# Patient Record
Sex: Female | Born: 1995
Health system: Southern US, Community
[De-identification: ages and names within clinical notes are randomized; demographics above are authoritative.]

## PROBLEM LIST (undated history)

## (undated) DIAGNOSIS — R7303 Prediabetes: Secondary | ICD-10-CM

## (undated) DIAGNOSIS — Z8619 Personal history of other infectious and parasitic diseases: Secondary | ICD-10-CM

## (undated) HISTORY — DX: Prediabetes: R73.03

## (undated) HISTORY — DX: Personal history of other infectious and parasitic diseases: Z86.19

---

## 2020-01-08 ENCOUNTER — Other Ambulatory Visit: Payer: Self-pay

## 2020-01-08 ENCOUNTER — Ambulatory Visit: Payer: 59 | Admitting: Obstetrics & Gynecology

## 2020-01-08 ENCOUNTER — Encounter: Payer: Self-pay | Admitting: Obstetrics & Gynecology

## 2020-01-08 ENCOUNTER — Other Ambulatory Visit (HOSPITAL_COMMUNITY)
Admission: RE | Admit: 2020-01-08 | Discharge: 2020-01-08 | Disposition: A | Discharge status: Home / Self Care | Payer: 59 | Source: Ambulatory Visit | Attending: Obstetrics & Gynecology | Admitting: Obstetrics & Gynecology

## 2020-01-08 VITALS — BP 120/80 | HR 68 | Temp 97.3°F | Resp 10 | Ht 63.25 in | Wt 177.0 lb

## 2020-01-08 DIAGNOSIS — Z01419 Encounter for gynecological examination (general) (routine) without abnormal findings: Secondary | ICD-10-CM | POA: Diagnosis not present

## 2020-01-08 DIAGNOSIS — Z124 Encounter for screening for malignant neoplasm of cervix: Secondary | ICD-10-CM

## 2020-01-08 NOTE — Progress Notes (Signed)
24 y.o. Lisa Rogers here as a new patient for an annual exam.  Works at [employer], [employer], and teaches K-2.  Is not SA.  Did have STD testing after last partner.    Cycles are regular.  Flow lasts about 5 days.  Flow is moderate.    Gardisil vaccination done in 2009.    Patient's last menstrual period was 12/30/2019.          Sexually active: No.  The current method of family planning is abstinence.    Exercising: No.  The patient does not participate in regular exercise at present. Smoker:  no  Health Maintenance: Pap:  05/28/16 Neg -- in Care Everywhere History of abnormal Pap:  no TDaP:  UTD per patient -- possibly done in 2014 before school Screening Labs: discuss if needed   reports that she has never smoked. She has never used smokeless tobacco. She reports current alcohol use. She reports that she does not use drugs.  Past Medical History:  Diagnosis Date  . STD (sexually transmitted disease)    chlamydia    History reviewed. No pertinent surgical history.  Current Outpatient Medications  Medication Sig Dispense Refill  . VENTOLIN HFA 108 (90 Base) MCG/ACT inhaler as needed.     No current facility-administered medications for this visit.    Family History  Problem Relation Age of Onset  . Hypertension Mother   . Arthritis Father   . Hypertension Sister   . Hypertension Maternal Grandmother   . Diabetes Paternal Grandfather   . Asthma Sister   . Gallstones Sister     Review of Systems  All other systems reviewed and are negative.   Exam:   BP 120/80 (BP Location: Right Arm, Patient Position: Sitting, Cuff Size: Normal)   Pulse 68   Temp (!) 97.3 F (36.3 C) (Temporal)   Resp 10   Ht 5' 3.25" (1.607 m)   Wt 177 lb (80.3 kg)   LMP 12/30/2019   BMI 31.11 kg/m    Height: 5' 3.25" (160.7 cm)  Ht Readings from Last 3 Encounters:  01/08/20 5' 3.25" (1.607 m)   General appearance: alert, cooperative  and appears stated age Head: Normocephalic, without obvious abnormality, atraumatic Neck: no adenopathy, supple, symmetrical, trachea midline and thyroid normal to inspection and palpation Lungs: clear to auscultation bilaterally Breasts: normal appearance, no masses or tenderness Heart: regular rate and rhythm Abdomen: soft, non-tender; bowel sounds normal; no masses,  no organomegaly Extremities: extremities normal, atraumatic, no cyanosis or edema Skin: Skin color, texture, turgor normal. No rashes or lesions Lymph nodes: Cervical, supraclavicular, and axillary nodes normal. No abnormal inguinal nodes palpated Neurologic: Grossly normal   Pelvic: External genitalia:  no lesions              Urethra:  normal appearing urethra with no masses, tenderness or lesions              Bartholins and Skenes: normal                 Vagina: normal appearing vagina with normal color and discharge, no lesions              Cervix: no lesions              Pap taken: Yes.   Bimanual Exam:  Uterus:  normal size, contour, position, consistency, mobility, non-tender              Adnexa:  normal adnexa and no mass, fullness, tenderness               Rectovaginal: Confirms               Anus:  normal sphincter tone, no lesions  Chaperone, Zenovia Jordan, CMA, was present for exam.  A:  Well Woman with normal exam Not SA H/o chlamydia in 2017 with negative follow up testing.  P:   Mammogram guidelines reviewed pap smear obtained today Return annually or prn

## 2020-01-10 LAB — CYTOLOGY - PAP: Diagnosis: NEGATIVE

## 2020-09-23 ENCOUNTER — Other Ambulatory Visit: Payer: Self-pay

## 2020-09-23 ENCOUNTER — Encounter: Payer: Self-pay | Admitting: Obstetrics and Gynecology

## 2020-09-23 ENCOUNTER — Ambulatory Visit: Payer: BC Managed Care – PPO | Admitting: Obstetrics and Gynecology

## 2020-09-23 ENCOUNTER — Telehealth: Payer: Self-pay

## 2020-09-23 VITALS — BP 110/64 | HR 87 | Ht 63.0 in | Wt 180.4 lb

## 2020-09-23 DIAGNOSIS — Z113 Encounter for screening for infections with a predominantly sexual mode of transmission: Secondary | ICD-10-CM | POA: Diagnosis not present

## 2020-09-23 NOTE — Progress Notes (Signed)
GYNECOLOGY  VISIT   HPI: 24 y.o.   Single Black or African American Not Hispanic or Latino  female   G0P0000 with Patient's last menstrual period was 09/11/2020.   here for STD testing. Patient has SA with new partner x 1-2 months. Patient is not having symptoms, no known exposure. No discharge, irritation, odor or itching.   GYNECOLOGIC HISTORY: Patient's last menstrual period was 09/11/2020. Contraception:condoms Menopausal hormone therapy: none         OB History    Gravida  0   Para  0   Term  0   Preterm  0   AB  0   Living  0     SAB  0   TAB  0   Ectopic  0   Multiple  0   Live Births  0              There are no problems to display for this patient.   Past Medical History:  Diagnosis Date  . History of chlamydia     History reviewed. No pertinent surgical history.  No current outpatient medications on file.   No current facility-administered medications for this visit.     ALLERGIES: Patient has no known allergies.  Family History  Problem Relation Age of Onset  . Hypertension Mother   . Arthritis Father   . Hypertension Sister   . Hypertension Maternal Grandmother   . Diabetes Paternal Grandfather   . Asthma Sister   . Gallstones Sister     Social History   Socioeconomic History  . Marital status: Single    Spouse name: Not on file  . Number of children: Not on file  . Years of education: Not on file  . Highest education level: Not on file  Occupational History  . Not on file  Tobacco Use  . Smoking status: Never Smoker  . Smokeless tobacco: Never Used  Vaping Use  . Vaping Use: Never used  Substance and Sexual Activity  . Alcohol use: Yes    Comment: occ  . Drug use: Never  . Sexual activity: Not Currently    Birth control/protection: Abstinence, None  Other Topics Concern  . Not on file  Social History Narrative  . Not on file   Social Determinants of Health   Financial Resource Strain:   . Difficulty of  Paying Living Expenses: Not on file  Food Insecurity:   . Worried About Programme researcher, broadcasting/film/video in the Last Year: Not on file  . Ran Out of Food in the Last Year: Not on file  Transportation Needs:   . Lack of Transportation (Medical): Not on file  . Lack of Transportation (Non-Medical): Not on file  Physical Activity:   . Days of Exercise per Week: Not on file  . Minutes of Exercise per Session: Not on file  Stress:   . Feeling of Stress : Not on file  Social Connections:   . Frequency of Communication with Friends and Family: Not on file  . Frequency of Social Gatherings with Friends and Family: Not on file  . Attends Religious Services: Not on file  . Active Member of Clubs or Organizations: Not on file  . Attends Banker Meetings: Not on file  . Marital Status: Not on file  Intimate Partner Violence:   . Fear of Current or Ex-Partner: Not on file  . Emotionally Abused: Not on file  . Physically Abused: Not on file  . Sexually  Abused: Not on file    Review of Systems  All other systems reviewed and are negative.   PHYSICAL EXAMINATION:    BP 110/64   Pulse 87   Ht 5\' 3"  (1.6 m)   Wt 180 lb 6.4 oz (81.8 kg)   LMP 09/11/2020   SpO2 98%   BMI 31.96 kg/m     General appearance: alert, cooperative and appears stated age  Pelvic: External genitalia:  no lesions              Urethra:  normal appearing urethra with no masses, tenderness or lesions              Bartholins and Skenes: normal                 Vagina: normal appearing vagina with a slight increase in watery, white vaginal d/c             Cervix: no lesions               Chaperone was present for exam.  ASSESSMENT Screening for STD Slight increase in vaginal d/c, no symptoms    PLAN GC/CT/trich/hiv/rpr Continue condoms for contraception and STD testing, declines other birth control

## 2020-09-23 NOTE — Telephone Encounter (Signed)
Appointment Request From: Amada Kingfisher    With Provider: Jerene Bears, MD [facility]]    Preferred Date Range: 09/23/2020 - 09/24/2020    Preferred Times: Any Time    Reason for visit: Request an Appointment    Comments:  STD/STI testing

## 2020-09-23 NOTE — Telephone Encounter (Signed)
AEX 01/2020- SM H/o Chlamydia 2017  Spoke with pt. Pt states having SA with new partner x 1-2 months ago and would like STD testing. Pt denies any symptoms or exposure of known STD. Pt advised to have OV. Pt agreeable.  Pt scheduled with Dr Oscar La on 11/22 at 11 am. Pt agreeable to date and time of appt. Pt verbalized understanding Dr Hyacinth Meeker not in office.  Encounter closed

## 2020-09-24 LAB — RPR: RPR Ser Ql: NONREACTIVE

## 2020-09-24 LAB — HIV ANTIBODY (ROUTINE TESTING W REFLEX): HIV Screen 4th Generation wRfx: NONREACTIVE

## 2020-09-27 LAB — CHLAMYDIA/GONOCOCCUS/TRICHOMONAS, NAA
Chlamydia by NAA: NEGATIVE
Gonococcus by NAA: NEGATIVE
Trich vag by NAA: NEGATIVE

## 2021-04-05 DIAGNOSIS — N912 Amenorrhea, unspecified: Secondary | ICD-10-CM | POA: Diagnosis not present

## 2021-04-07 ENCOUNTER — Ambulatory Visit: Payer: BC Managed Care – PPO | Admitting: Obstetrics and Gynecology

## 2021-04-11 ENCOUNTER — Ambulatory Visit: Payer: 59

## 2021-04-14 ENCOUNTER — Ambulatory Visit: Payer: BC Managed Care – PPO | Admitting: Obstetrics and Gynecology

## 2021-04-15 ENCOUNTER — Ambulatory Visit: Payer: BC Managed Care – PPO | Admitting: Obstetrics and Gynecology

## 2021-04-25 ENCOUNTER — Ambulatory Visit: Payer: Self-pay | Admitting: Obstetrics and Gynecology

## 2021-05-22 NOTE — Progress Notes (Signed)
25 y.o. G0P0000 Single Black or African American Not Hispanic or Latino female here for annual exam.  Patient had medical abortion in June. Patient states that since she started taking the birth control she had been spotting constantly. She just finished her first pack of pills. Doing okay on OCP's other than the spotting.    She is sexually active, same partner x 4 months.   Patient's last menstrual period was 03/05/2021.          Sexually active: Yes.    The current method of family planning is OCP (estrogen/progesterone).    Exercising: Yes.     Walking  Smoker:  no  Health Maintenance: Pap:  01/08/20 Normal ,05/28/16 Neg -- in Care Everywhere History of abnormal Pap:  no MMG:  none  BMD:   none  Colonoscopy: none  [facility]P:  [facility] Gardasil: Complete    reports that she has never smoked. She has never used smokeless tobacco. She reports current alcohol use. She reports that she does not use drugs. Just occasional ETOH.  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   Past Medical History:  Diagnosis Date   History of chlamydia     No past surgical history on file.  Current Outpatient Medications  Medication Sig Dispense Refill   albuterol (VENTOLIN HFA) 108 (90 Base) MCG/ACT inhaler if needed.     norgestimate-ethinyl estradiol (ORTHO-CYCLEN) 0.25-35 MG-MCG tablet Take 1 tablet by mouth daily.     No current facility-administered medications for this visit.    Family History  Problem Relation Age of Onset   Hypertension Mother    Arthritis Father    Hypertension Sister    Hypertension Maternal Grandmother    Diabetes Paternal Grandfather    Asthma Sister    Gallstones Sister     Review of Systems  All other systems reviewed and are negative.  Exam:   BP 112/66   Pulse 67   Ht 5\' 3"  (1.6 m)   Wt 184 lb (83.5 kg)   LMP 03/05/2021   SpO2 98%   BMI 32.59 kg/m   Weight  change: @WEIGHTCHANGE @ Height:   Height: 5\' 3"  (160 cm)  Ht Readings from Last 3 Encounters:  05/26/21 5\' 3"  (1.6 m)  09/23/20 5\' 3"  (1.6 m)  01/08/20 5' 3.25" (1.607 m)    General appearance: alert, cooperative and appears stated age Head: Normocephalic, without obvious abnormality, atraumatic Neck: no adenopathy, supple, symmetrical, trachea midline and thyroid normal to inspection and palpation Lungs: clear to auscultation bilaterally Cardiovascular: regular rate and rhythm Breasts: normal appearance, no masses or tenderness Abdomen: soft, non-tender; non distended,  no masses,  no organomegaly Extremities: extremities normal, atraumatic, no cyanosis or edema Skin: Skin color, texture, turgor normal. No rashes or lesions Lymph nodes: Cervical, supraclavicular, and axillary nodes normal. No abnormal inguinal nodes palpated Neurologic: Grossly normal   Pelvic: External genitalia:  no lesions              Urethra:  normal appearing urethra with no masses, tenderness or lesions              Bartholins and Skenes: normal                 Vagina: normal appearing vagina with normal color and discharge, no lesions              Cervix: no lesions  Bimanual Exam:  Uterus:  normal size, contour, position, consistency, mobility, non-tender              Adnexa: no mass, fullness, tenderness               Rectovaginal: Confirms               Anus:  normal sphincter tone, no lesions  Carolynn Serve chaperoned for the exam.  1. Well woman exam Discussed breast self exam Discussed calcium and vit D intake No pap this year  2. Screening examination for STD (sexually transmitted disease) - HIV Antibody (routine testing w rflx) - RPR - SURESWAB CT/NG/T. vaginalis  3. Laboratory exam ordered as part of routine general medical examination - CBC - Comprehensive metabolic panel - Lipid panel  4. Encounter for surveillance of contraceptive pills - norgestimate-ethinyl estradiol  (ORTHO-CYCLEN) 0.25-35 MG-MCG tablet; Take 1 tablet by mouth daily.  Dispense: 84 tablet; Refill:  -Condom use encouraged

## 2021-05-23 ENCOUNTER — Ambulatory Visit: Payer: Self-pay | Admitting: Obstetrics and Gynecology

## 2021-05-26 ENCOUNTER — Ambulatory Visit (INDEPENDENT_AMBULATORY_CARE_PROVIDER_SITE_OTHER): Payer: BC Managed Care – PPO | Admitting: Obstetrics and Gynecology

## 2021-05-26 ENCOUNTER — Other Ambulatory Visit: Payer: Self-pay

## 2021-05-26 ENCOUNTER — Encounter: Payer: Self-pay | Admitting: Obstetrics and Gynecology

## 2021-05-26 VITALS — BP 112/66 | HR 67 | Ht 63.0 in | Wt 184.0 lb

## 2021-05-26 DIAGNOSIS — R1310 Dysphagia, unspecified: Secondary | ICD-10-CM | POA: Insufficient documentation

## 2021-05-26 DIAGNOSIS — Z0000 Encounter for general adult medical examination without abnormal findings: Secondary | ICD-10-CM

## 2021-05-26 DIAGNOSIS — Z01419 Encounter for gynecological examination (general) (routine) without abnormal findings: Secondary | ICD-10-CM

## 2021-05-26 DIAGNOSIS — Z3041 Encounter for surveillance of contraceptive pills: Secondary | ICD-10-CM

## 2021-05-26 DIAGNOSIS — Z113 Encounter for screening for infections with a predominantly sexual mode of transmission: Secondary | ICD-10-CM | POA: Diagnosis not present

## 2021-05-26 MED ORDER — NORGESTIMATE-ETH ESTRADIOL 0.25-35 MG-MCG PO TABS: 1.0000 | ORAL_TABLET | Freq: Every day | ORAL | 3 refills | Status: DC

## 2021-05-26 NOTE — Patient Instructions (Signed)

## 2021-05-27 ENCOUNTER — Encounter: Payer: Self-pay | Admitting: Obstetrics and Gynecology

## 2021-05-27 LAB — SURESWAB CT/NG/T. VAGINALIS
C. trachomatis RNA, TMA: NOT DETECTED
N. gonorrhoeae RNA, TMA: NOT DETECTED
Trichomonas vaginalis RNA: NOT DETECTED

## 2021-05-30 LAB — CBC
HCT: 36.8 % (ref 35.0–45.0)
Hemoglobin: 11.9 g/dL (ref 11.7–15.5)
MCH: 27.4 pg (ref 27.0–33.0)
MCHC: 32.3 g/dL (ref 32.0–36.0)
MCV: 84.8 fL (ref 80.0–100.0)
MPV: 11.4 fL (ref 7.5–12.5)
Platelets: 365 10*3/uL (ref 140–400)
RBC: 4.34 10*6/uL (ref 3.80–5.10)
RDW: 14 % (ref 11.0–15.0)
WBC: 6.5 10*3/uL (ref 3.8–10.8)

## 2021-05-30 LAB — COMPREHENSIVE METABOLIC PANEL
AG Ratio: 1.3 (calc) (ref 1.0–2.5)
ALT: 12 U/L (ref 6–29)
AST: 13 U/L (ref 10–30)
Albumin: 4.2 g/dL (ref 3.6–5.1)
Alkaline phosphatase (APISO): 50 U/L (ref 31–125)
BUN: 7 mg/dL (ref 7–25)
CO2: 26 mmol/L (ref 20–32)
Calcium: 9.5 mg/dL (ref 8.6–10.2)
Chloride: 103 mmol/L (ref 98–110)
Creat: 0.85 mg/dL (ref 0.50–0.96)
Globulin: 3.3 g/dL (calc) (ref 1.9–3.7)
Glucose, Bld: 111 mg/dL — ABNORMAL HIGH (ref 65–99)
Potassium: 4.3 mmol/L (ref 3.5–5.3)
Sodium: 138 mmol/L (ref 135–146)
Total Bilirubin: 0.3 mg/dL (ref 0.2–1.2)
Total Protein: 7.5 g/dL (ref 6.1–8.1)

## 2021-05-30 LAB — HEMOGLOBIN A1C
Hgb A1c MFr Bld: 5.7 % of total Hgb — ABNORMAL HIGH (ref <5.7)
Mean Plasma Glucose: 117 mg/dL
eAG (mmol/L): 6.5 mmol/L

## 2021-05-30 LAB — LIPID PANEL
Cholesterol: 242 mg/dL — ABNORMAL HIGH (ref <200)
HDL: 51 mg/dL (ref >=50)
LDL Cholesterol (Calc): 162 mg/dL (calc) — ABNORMAL HIGH
Non-HDL Cholesterol (Calc): 191 mg/dL (calc) — ABNORMAL HIGH (ref <130)
Total CHOL/HDL Ratio: 4.7 (calc) (ref <5.0)
Triglycerides: 150 mg/dL — ABNORMAL HIGH (ref <150)

## 2021-05-30 LAB — HIV ANTIBODY (ROUTINE TESTING W REFLEX): HIV 1&2 Ab, 4th Generation: NONREACTIVE

## 2021-05-30 LAB — RPR: RPR Ser Ql: NONREACTIVE

## 2021-06-02 ENCOUNTER — Other Ambulatory Visit: Payer: Self-pay | Admitting: *Deleted

## 2021-06-02 DIAGNOSIS — R7303 Prediabetes: Secondary | ICD-10-CM

## 2021-07-10 ENCOUNTER — Other Ambulatory Visit: Payer: Self-pay

## 2021-07-10 ENCOUNTER — Encounter: Payer: BC Managed Care – PPO | Attending: Obstetrics and Gynecology | Admitting: Skilled Nursing Facility1

## 2021-07-10 ENCOUNTER — Encounter: Payer: Self-pay | Admitting: Skilled Nursing Facility1

## 2021-07-10 DIAGNOSIS — R7303 Prediabetes: Secondary | ICD-10-CM

## 2021-07-10 NOTE — Progress Notes (Signed)
Medical Nutrition Therapy  Appointment Start time:  2:44  Appointment End time:  3:38  Primary concerns today: prediabetes/cholesterol  Referral diagnosis: prediabetes Preferred learning style: no preference indicated Learning readiness: change in progress  Body Composition Scale 07/10/2021  Current Body Weight 186.5  Total Body Fat % 36.1  Visceral Fat 8  Fat-Free Mass % 63.8   Total Body Water % 46.4  Muscle-Mass lbs 32.1  BMI 32.6  Body Fat Displacement          Torso  lbs 41.6         Left Leg  lbs 8.3         Right Leg  lbs 8.3         Left Arm  lbs 4.1         Right Arm   lbs 4.1     NUTRITION ASSESSMENT    Clinical Medical Hx: prediabetes  Medications: N/A Labs: cholesterol 242, LDL cholesterol 162, prediabetes 5.7 Notable Signs/Symptoms: N/A  Lifestyle & Dietary Hx   Estimated daily fluid intake: 50 oz Supplements: N/A Sleep: 7 hours sometimes not restful 3-4 times a week Stress / self-care: worse over the summer but leveled out now Current average weekly physical activity: walking 3 days week sometimes running bleaches  24-Hr Dietary Recall First Meal:  Snack:  Second Meal 12-1pm: eaten out  Snack:  Third Meal 7-8pm: eaten out Snack:  Beverages: water, soda (20oz), 3 times a week sweet tea (20oz)  Estimated Energy Needs Calories: 1600   NUTRITION INTERVENTION  Nutrition education (E-1) on the following topics:  Balanced meals Prediabetes Cholesterol Saturated fat  Handouts Provided Include  Detailed MyPlate marked for pt  Learning Style & Readiness for Change Teaching method utilized: Visual & Auditory  Demonstrated degree of understanding via: Teach Back  Barriers to learning/adherence to lifestyle change: none identified   Goals Established by Pt Limit eating out to once a week Create balanced meals Eat breakfast such as yogurt   MONITORING & EVALUATION Dietary intake, weekly physical activity  Next Steps  Patient is to email  if needed.

## 2021-08-31 ENCOUNTER — Ambulatory Visit (INDEPENDENT_AMBULATORY_CARE_PROVIDER_SITE_OTHER): Payer: BC Managed Care – PPO

## 2021-08-31 ENCOUNTER — Ambulatory Visit
Admission: EM | Admit: 2021-08-31 | Discharge: 2021-08-31 | Disposition: A | Discharge status: Home / Self Care | Payer: BC Managed Care – PPO | Attending: Physician Assistant | Admitting: Physician Assistant

## 2021-08-31 ENCOUNTER — Other Ambulatory Visit: Payer: Self-pay

## 2021-08-31 DIAGNOSIS — S99922A Unspecified injury of left foot, initial encounter: Secondary | ICD-10-CM

## 2021-08-31 DIAGNOSIS — M79675 Pain in left toe(s): Secondary | ICD-10-CM | POA: Diagnosis not present

## 2021-08-31 NOTE — ED Triage Notes (Signed)
Pt present left foot/big toe injury. Pt states she went zip lining and stomped her big toe at the end of the lining. Pt toe is swollen with limit movement.

## 2021-08-31 NOTE — ED Provider Notes (Signed)
[facility]    CSN: [id number] Arrival date & time: 08/31/21  1159      History   Chief Complaint Chief Complaint  Patient presents with   Toe Injury    HPI Lisa Rogers is a 25 y.o. female.   Patient here today for evaluation of left great toe injury that occurred while zip lining.  She reports that she did not stop and stubbed her toe at the end of the line.  She has had pain since that time.  Movement makes pain worse.  She also endorses some swelling.  She does not report any treatment for symptoms.  The history is provided by the patient.   Past Medical History:  Diagnosis Date   History of chlamydia    Prediabetes     Patient Active Problem List   Diagnosis Date Noted   Dysphagia 05/26/2021    History reviewed. No pertinent surgical history.  OB History     Gravida  1   Para  0   Term  0   Preterm  0   AB  1   Living  0      SAB  0   IAB  1   Ectopic  0   Multiple  0   Live Births  0            Home Medications    Prior to Admission medications   Medication Sig Start Date End Date Taking? Authorizing Provider  albuterol (VENTOLIN HFA) 108 (90 Base) MCG/ACT inhaler if needed. 10/24/19   [provider]  norgestimate-ethinyl estradiol (ORTHO-CYCLEN) 0.25-35 MG-MCG tablet Take 1 tablet by mouth daily. 05/26/21   Romualdo Bolk, MD  omeprazole (PRILOSEC) 20 MG capsule Take 20 mg by mouth daily. 12/31/20   [provider]    Family History Family History  Problem Relation Age of Onset   Hypertension Mother    Arthritis Father    Hypertension Sister    Hypertension Maternal Grandmother    Diabetes Paternal Grandfather    Asthma Sister    Gallstones Sister     Social History Social History   Tobacco Use   Smoking status: Never   Smokeless tobacco: Never  Vaping Use   Vaping Use: Never used  Substance Use Topics   Alcohol use: Yes    Comment: occ   Drug use: Never     Allergies    Patient has no known allergies.   Review of Systems Review of Systems  Constitutional:  Negative for chills and fever.  Eyes:  Negative for discharge and redness.  Respiratory:  Negative for shortness of breath.   Gastrointestinal:  Negative for abdominal pain, nausea and vomiting.  Genitourinary:  Positive for vaginal bleeding and vaginal discharge.  Musculoskeletal:  Positive for arthralgias and joint swelling.  Skin:  Positive for color change.  Neurological:  Negative for numbness.    Physical Exam Triage Vital Signs ED Triage Vitals [08/31/21 1304]  Enc Vitals Group     BP 124/80     Pulse Rate 85     Resp 16     Temp 98.6 F (37 C)     Temp Source Oral     SpO2 96 %     Weight      Height      Head Circumference      Peak Flow      Pain Score 9     Pain Loc  Pain Edu?      Excl. in GC?    No data found.  Updated Vital Signs BP 124/80 (BP Location: Left Arm)   Pulse 85   Temp 98.6 F (37 C) (Oral)   Resp 16   LMP 08/24/2021   SpO2 96%      Physical Exam Vitals and nursing note reviewed.  Constitutional:      General: She is not in acute distress.    Appearance: Normal appearance. She is not ill-appearing.  HENT:     Head: Normocephalic and atraumatic.  Eyes:     Conjunctiva/sclera: Conjunctivae normal.  Cardiovascular:     Rate and Rhythm: Normal rate.  Pulmonary:     Effort: Pulmonary effort is normal.  Musculoskeletal:     Comments: Mild swelling and erythema noted diffusely to left great toe. Decreased ROM of toe due to pain. Normal ROM or left ankle.   Skin:    Capillary Refill: Normal cap refill to left great toe. Neurological:     Mental Status: She is alert.     Comments: Gross sensation intact to left great toe  Psychiatric:        Mood and Affect: Mood normal.        Behavior: Behavior normal.        Thought Content: Thought content normal.     UC Treatments / Results  Labs (all labs ordered are listed, but only abnormal  results are displayed) Labs Reviewed - No data to display  EKG   Radiology DG Toe Great Left  Result Date: 08/31/2021 CLINICAL DATA:  Left great toe pain after injury. EXAM: LEFT GREAT TOE COMPARISON:  None. FINDINGS: There is no evidence of fracture or dislocation. There is no evidence of arthropathy or other focal bone abnormality. Soft tissues are unremarkable. IMPRESSION: Negative. Electronically Signed   By: Lupita Raider M.D.   On: 08/31/2021 13:53    Procedures Procedures (including critical care time)  Medications Ordered in UC Medications - No data to display  Initial Impression / Assessment and Plan / UC Course  I have reviewed the triage vital signs and the nursing notes.  Pertinent labs & imaging results that were available during my care of the patient were reviewed by me and considered in my medical decision making (see chart for details).  X-ray without fracture or other acute abnormality noted.  Recommended ibuprofen if needed as well as ice.  Encouraged follow-up with any further concerns or symptoms fail to improve with time.  Final Clinical Impressions(s) / UC Diagnoses   Final diagnoses:  Injury of left great toe, initial encounter   Discharge Instructions   None    ED Prescriptions   None    PDMP not reviewed this encounter.   Tomi Bamberger, PA-C 08/31/21 [phone]

## 2021-11-13 DIAGNOSIS — F419 Anxiety disorder, unspecified: Secondary | ICD-10-CM | POA: Diagnosis not present

## 2021-11-24 DIAGNOSIS — F419 Anxiety disorder, unspecified: Secondary | ICD-10-CM | POA: Diagnosis not present

## 2021-12-11 DIAGNOSIS — F419 Anxiety disorder, unspecified: Secondary | ICD-10-CM | POA: Diagnosis not present

## 2021-12-25 DIAGNOSIS — F419 Anxiety disorder, unspecified: Secondary | ICD-10-CM | POA: Diagnosis not present

## 2022-01-05 DIAGNOSIS — F419 Anxiety disorder, unspecified: Secondary | ICD-10-CM | POA: Diagnosis not present

## 2022-01-22 DIAGNOSIS — F419 Anxiety disorder, unspecified: Secondary | ICD-10-CM | POA: Diagnosis not present

## 2022-02-05 DIAGNOSIS — F419 Anxiety disorder, unspecified: Secondary | ICD-10-CM | POA: Diagnosis not present

## 2022-02-19 DIAGNOSIS — F419 Anxiety disorder, unspecified: Secondary | ICD-10-CM | POA: Diagnosis not present

## 2022-06-22 DIAGNOSIS — J209 Acute bronchitis, unspecified: Secondary | ICD-10-CM | POA: Diagnosis not present

## 2022-07-10 NOTE — Progress Notes (Signed)
26 y.o. G1P0010 Single Black or African American Not Hispanic or Latino female here for annual exam.  Not currently sexually active.  Period Cycle (Days):  (28-30) Period Duration (Days): 5-6 Menstrual Flow:  (varies) Menstrual Control: Maxi pad Dysmenorrhea: (!) Moderate Dysmenorrhea Symptoms: Cramping  She has been more active, changed her eating habits. She is down ~10 lbs.   She is working full time in a stressful job and going to school full time to get her masters in Holiday representative.   Patient's last menstrual period was 07/14/2022 (exact date).          Sexually active: No.  The current method of family planning: not currently sexually active.   Exercising: No.     Smoker:  no  Health Maintenance: Pap: 01/08/2020-WNL, 05/28/2016-WNL History of abnormal Pap: no MMG: never BMD: never Colonoscopy: never T[facility]P: 06/[facility] pt Gardasil: Completed   reports that she has never smoked. She has never used smokeless tobacco. She reports current alcohol use. She reports that she does not use drugs. Rare ETOH. 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 Back in school, getting a second masters in Psychology (3 year program).   Past Medical History:  Diagnosis Date   History of chlamydia    Prediabetes     History reviewed. No pertinent surgical history.  Current Outpatient Medications  Medication Sig Dispense Refill   albuterol (VENTOLIN HFA) 108 (90 Base) MCG/ACT inhaler if needed.     No current facility-administered medications for this visit.    Family History  Problem Relation Age of Onset   Hypertension Mother    Arthritis Father    Hypertension Sister    Hypertension Maternal Grandmother    Diabetes Paternal Grandfather    Asthma Sister    Gallstones Sister     Review of Systems  All other systems reviewed and are negative.   Exam:   BP 112/72   Pulse 66   Ht 5\' 3"  (1.6 m) Comment: per pt  Wt 175 lb  (79.4 kg)   LMP 07/14/2022 (Exact Date)   SpO2 98%   BMI 31.00 kg/m   Weight change: @WEIGHTCHANGE @ Height:   Height: 5\' 3"  (160 cm) (per pt)  Ht Readings from Last 3 Encounters:  07/24/22 5\' 3"  (1.6 m)  07/10/21 5\' 3"  (1.6 m)  05/26/21 5\' 3"  (1.6 m)    General appearance: alert, cooperative and appears stated age Head: Normocephalic, without obvious abnormality, atraumatic Neck: no adenopathy, supple, symmetrical, trachea midline and thyroid normal to inspection and palpation Lungs: clear to auscultation bilaterally Cardiovascular: regular rate and rhythm Breasts: normal appearance, no masses or tenderness Abdomen: soft, non-tender; non distended,  no masses,  no organomegaly Extremities: extremities normal, atraumatic, no cyanosis or edema Skin: Skin color, texture, turgor normal. No rashes or lesions Lymph nodes: Cervical, supraclavicular, and axillary nodes normal. No abnormal inguinal nodes palpated Neurologic: Grossly normal   Pelvic: External genitalia:  no lesions              Urethra:  normal appearing urethra with no masses, tenderness or lesions              Bartholins and Skenes: normal                 Vagina: normal appearing vagina with normal color and discharge, no lesions              Cervix: no lesions  Bimanual Exam:  Uterus:  normal size, contour, position, consistency, mobility, non-tender and anteverted              Adnexa: no mass, fullness, tenderness               Rectovaginal: Confirms               Anus:  normal sphincter tone, no lesions  Ladona Ridgel, CMA chaperoned for the exam.  1. Well woman exam Discussed breast self exam Discussed calcium and vit D intake  2. Screening for cervical cancer - Cytology - PAP  3. Prediabetes - Hemoglobin A1c  4. Lipids abnormal - Lipid panel

## 2022-07-24 ENCOUNTER — Ambulatory Visit (INDEPENDENT_AMBULATORY_CARE_PROVIDER_SITE_OTHER): Payer: BC Managed Care – PPO | Admitting: Obstetrics and Gynecology

## 2022-07-24 ENCOUNTER — Other Ambulatory Visit (HOSPITAL_COMMUNITY)
Admission: RE | Admit: 2022-07-24 | Discharge: 2022-07-24 | Disposition: A | Discharge status: Home / Self Care | Payer: BC Managed Care – PPO | Source: Ambulatory Visit | Attending: Obstetrics and Gynecology | Admitting: Obstetrics and Gynecology

## 2022-07-24 ENCOUNTER — Encounter: Payer: Self-pay | Admitting: Obstetrics and Gynecology

## 2022-07-24 VITALS — BP 112/72 | HR 66 | Ht 63.0 in | Wt 175.0 lb

## 2022-07-24 DIAGNOSIS — Z124 Encounter for screening for malignant neoplasm of cervix: Secondary | ICD-10-CM | POA: Diagnosis not present

## 2022-07-24 DIAGNOSIS — Z01419 Encounter for gynecological examination (general) (routine) without abnormal findings: Secondary | ICD-10-CM | POA: Diagnosis not present

## 2022-07-24 DIAGNOSIS — R7303 Prediabetes: Secondary | ICD-10-CM | POA: Diagnosis not present

## 2022-07-24 DIAGNOSIS — E7889 Other lipoprotein metabolism disorders: Secondary | ICD-10-CM | POA: Diagnosis not present

## 2022-07-24 NOTE — Patient Instructions (Signed)

## 2022-07-25 LAB — LIPID PANEL
Cholesterol: 198 mg/dL (ref <200)
HDL: 46 mg/dL — ABNORMAL LOW (ref >=50)
LDL Cholesterol (Calc): 134 mg/dL (calc) — ABNORMAL HIGH
Non-HDL Cholesterol (Calc): 152 mg/dL (calc) — ABNORMAL HIGH (ref <130)
Total CHOL/HDL Ratio: 4.3 (calc) (ref <5.0)
Triglycerides: 85 mg/dL (ref <150)

## 2022-07-25 LAB — HEMOGLOBIN A1C
Hgb A1c MFr Bld: 5.8 % of total Hgb — ABNORMAL HIGH (ref <5.7)
Mean Plasma Glucose: 120 mg/dL
eAG (mmol/L): 6.6 mmol/L

## 2022-07-28 LAB — CYTOLOGY - PAP
Diagnosis: NEGATIVE
Diagnosis: REACTIVE

## 2023-04-27 DIAGNOSIS — R21 Rash and other nonspecific skin eruption: Secondary | ICD-10-CM | POA: Diagnosis not present

## 2023-05-13 IMAGING — DX DG TOE GREAT 2+V*L*
2 series · 2 of 2 positions shown · non-contrast
Comparison: None.

CLINICAL DATA: Left great toe pain after injury.

EXAM:
LEFT GREAT TOE

[toes dp]
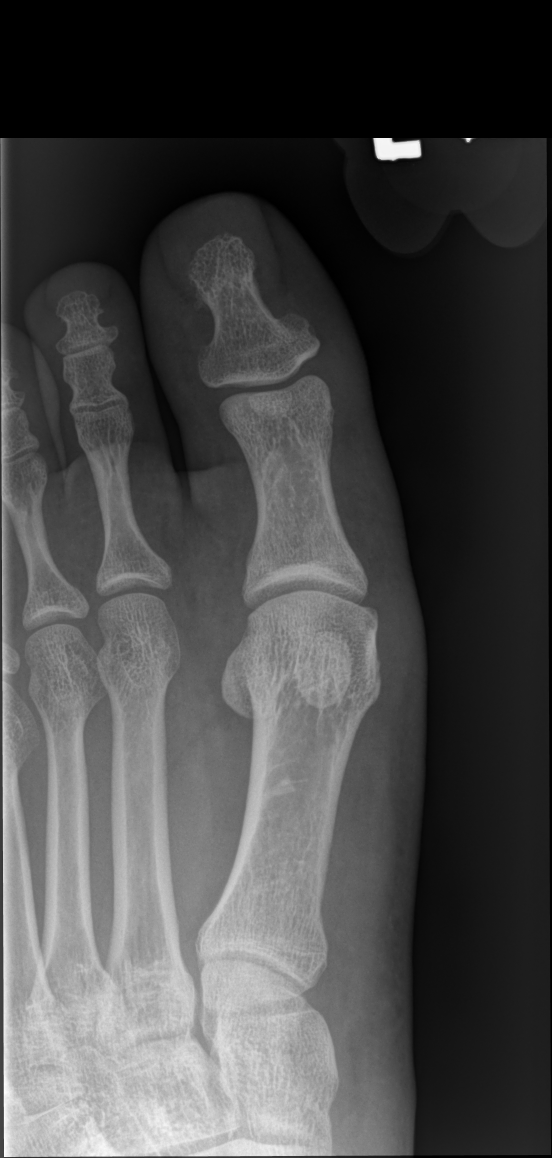

[toes lat]
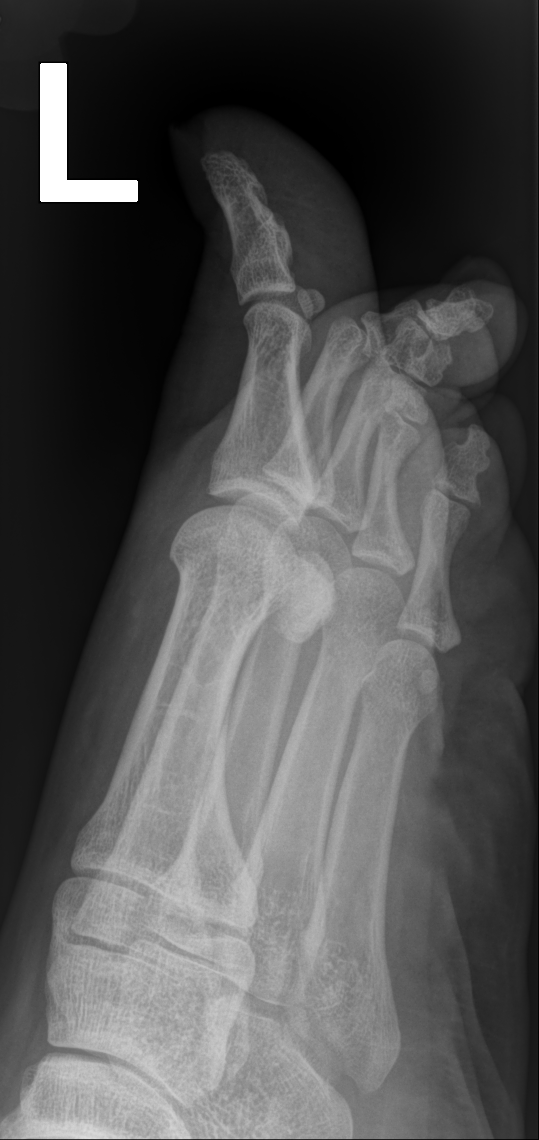

[2 of 2 positions shown; findings below may reference images not displayed]

FINDINGS: There is no evidence of fracture or dislocation. There is no
evidence of arthropathy or other focal bone abnormality. Soft
tissues are unremarkable.
IMPRESSION: Negative.

## 2023-06-05 DIAGNOSIS — L298 Other pruritus: Secondary | ICD-10-CM | POA: Diagnosis not present

## 2023-06-05 DIAGNOSIS — R21 Rash and other nonspecific skin eruption: Secondary | ICD-10-CM | POA: Diagnosis not present

## 2023-07-11 DIAGNOSIS — S2341XA Sprain of ribs, initial encounter: Secondary | ICD-10-CM | POA: Diagnosis not present

## 2023-07-11 DIAGNOSIS — R079 Chest pain, unspecified: Secondary | ICD-10-CM | POA: Diagnosis not present

## 2023-12-20 ENCOUNTER — Ambulatory Visit: Payer: BC Managed Care – PPO | Admitting: Obstetrics and Gynecology

## 2024-01-14 ENCOUNTER — Ambulatory Visit: Payer: BC Managed Care – PPO | Admitting: Obstetrics and Gynecology

## 2024-02-24 ENCOUNTER — Ambulatory Visit (INDEPENDENT_AMBULATORY_CARE_PROVIDER_SITE_OTHER): Payer: Self-pay | Admitting: Obstetrics and Gynecology

## 2024-02-24 ENCOUNTER — Encounter: Payer: Self-pay | Admitting: Obstetrics and Gynecology

## 2024-02-24 VITALS — BP 104/62 | HR 77 | Ht 64.25 in | Wt 185.0 lb

## 2024-02-24 DIAGNOSIS — Z1331 Encounter for screening for depression: Secondary | ICD-10-CM

## 2024-02-24 DIAGNOSIS — E559 Vitamin D deficiency, unspecified: Secondary | ICD-10-CM

## 2024-02-24 DIAGNOSIS — R7303 Prediabetes: Secondary | ICD-10-CM | POA: Diagnosis not present

## 2024-02-24 DIAGNOSIS — Z6831 Body mass index (BMI) 31.0-31.9, adult: Secondary | ICD-10-CM | POA: Diagnosis not present

## 2024-02-24 DIAGNOSIS — Z01419 Encounter for gynecological examination (general) (routine) without abnormal findings: Secondary | ICD-10-CM | POA: Diagnosis not present

## 2024-02-24 DIAGNOSIS — E7889 Other lipoprotein metabolism disorders: Secondary | ICD-10-CM | POA: Diagnosis not present

## 2024-02-24 NOTE — Patient Instructions (Signed)

## 2024-02-24 NOTE — Assessment & Plan Note (Signed)
 Cervical cancer screening performed according to ASCCP guidelines. Labs today Encouraged safe sexual practices as indicated Encouraged healthy lifestyle practices with diet and exercise For patients under 30yo, I recommend 1000mg  calcium daily and 600IU of vitamin D daily.

## 2024-02-24 NOTE — Progress Notes (Signed)
 28 y.o. [id number] female here for annual exam. Single. Completed master's in psychology, works Engineer, maintenance.  Patient's last menstrual period was 02/09/2024 (approximate). Period Duration (Days): 6 Period Pattern: Regular Menstrual Flow: Moderate Menstrual Control: Maxi pad Dysmenorrhea: (!) Moderate  No complaints. Doing well.  Abnormal bleeding: none Pelvic discharge or pain: none Breast mass, nipple discharge or skin changes : none Birth control: abstinent Last PAP:     Component Value Date/Time   DIAGPAP  07/24/2022 0917    - Negative for Intraepithelial Lesions or Malignancy (NILM)   DIAGPAP - Benign reactive/reparative changes 07/24/2022 0917   DIAGPAP  01/08/2020 1553    - Negative for intraepithelial lesion or malignancy (NILM)   ADEQPAP  07/24/2022 0917    Satisfactory for evaluation; transformation zone component PRESENT.   ADEQPAP  01/08/2020 1553    Satisfactory for evaluation; transformation zone component PRESENT.   Gardasil: completed Sexually active: no  Exercising: no Smoker: no  Garment/textile technologist Visit from 02/24/2024 in [facility]  PHQ-2 Total Score 0         GYN HISTORY: No sig hx  OB History  Gravida Para Term Preterm AB Living  1 0 0 0 1 0  SAB IAB Ectopic Multiple Live Births  0 1 0 0 0    # Outcome Date GA Lbr Len/2nd Weight Sex Type Anes PTL Lv  1 IAB             Past Medical History:  Diagnosis Date   History of chlamydia    Prediabetes     History reviewed. No pertinent surgical history.  No current outpatient medications on file prior to visit.   No current facility-administered medications on file prior to visit.    Social History   Socioeconomic History   Marital status: Single    Spouse name: Not on file   Number of children: Not on file   Years of education: Not on file   Highest education level: Not on file  Occupational History   Not on file  Tobacco Use   Smoking  status: Never   Smokeless tobacco: Never  Vaping Use   Vaping status: Never Used  Substance and Sexual Activity   Alcohol use: Yes    Comment: occ   Drug use: Never   Sexual activity: Not Currently  Other Topics Concern   Not on file  Social History Narrative   Not on file   Social Drivers of Health   Financial Resource Strain: Not on file  Food Insecurity: Not on file  Transportation Needs: Not on file  Physical Activity: Not on file  Stress: Not on file  Social Connections: Not on file  Intimate Partner Violence: Not on file    Family History  Problem Relation Age of Onset   Hypertension Mother    Arthritis Father    Hypertension Sister    Hypertension Maternal Grandmother    Diabetes Paternal Grandfather    Asthma Sister    Gallstones Sister     No Known Allergies    PE Today's Vitals   02/24/24 1342  BP: 104/62  Pulse: 77  SpO2: 99%  Weight: 185 lb (83.9 kg)  Height: 5' 4.25" (1.632 m)   Body mass index is 31.51 kg/m.  Physical Exam Vitals reviewed. Exam conducted with a chaperone present.  Constitutional:      General: She is not in acute distress.    Appearance: Normal appearance.  HENT:  Head: Normocephalic and atraumatic.     Nose: Nose normal.  Eyes:     Extraocular Movements: Extraocular movements intact.     Conjunctiva/sclera: Conjunctivae normal.  Neck:     Thyroid: No thyroid mass, thyromegaly or thyroid tenderness.  Pulmonary:     Effort: Pulmonary effort is normal.  Chest:     Chest wall: No mass or tenderness.  Breasts:    Right: Normal. No swelling, mass, nipple discharge, skin change or tenderness.     Left: Normal. No swelling, mass, nipple discharge, skin change or tenderness.  Abdominal:     General: There is no distension.     Palpations: Abdomen is soft.     Tenderness: There is no abdominal tenderness.  Genitourinary:    General: Normal vulva.     Exam position: Lithotomy position.     Urethra: No prolapse.      Vagina: Normal. No vaginal discharge or bleeding.     Cervix: Normal. No lesion.     Uterus: Normal. Not enlarged and not tender.      Adnexa: Right adnexa normal and left adnexa normal.  Musculoskeletal:        General: Normal range of motion.     Cervical back: Normal range of motion.  Lymphadenopathy:     Upper Body:     Right upper body: No axillary adenopathy.     Left upper body: No axillary adenopathy.     Lower Body: No right inguinal adenopathy. No left inguinal adenopathy.  Skin:    General: Skin is warm and dry.  Neurological:     General: No focal deficit present.     Mental Status: She is alert.  Psychiatric:        Mood and Affect: Mood normal.        Behavior: Behavior normal.       Assessment and Plan:        Well woman exam with routine gynecological exam Assessment & Plan: Cervical cancer screening performed according to ASCCP guidelines. Labs today Encouraged safe sexual practices as indicated Encouraged healthy lifestyle practices with diet and exercise For patients under 50yo, I recommend 1000mg  calcium daily and 600IU of vitamin D daily.   Orders: -     VITAMIN D 25 Hydroxy (Vit-D Deficiency, Fractures) -     Thyroid Panel With TSH -     CBC -     COMPLETE METABOLIC PANEL WITHOUT GFR  Lipids abnormal -     Lipid panel  Prediabetes -     Hemoglobin A1C w/out eAG  BMI 31.0-31.9,adult Assessment & Plan: Encouraged healthy diet and exercise.      Romaine Closs, MD

## 2024-02-24 NOTE — Assessment & Plan Note (Signed)
 Encouraged healthy diet and exercise.

## 2024-02-25 LAB — CBC
HCT: 37.4 % (ref 35.0–45.0)
Hemoglobin: 12.1 g/dL (ref 11.7–15.5)
MCH: 26.5 pg — ABNORMAL LOW (ref 27.0–33.0)
MCHC: 32.4 g/dL (ref 32.0–36.0)
MCV: 81.8 fL (ref 80.0–100.0)
MPV: 12 fL (ref 7.5–12.5)
Platelets: 339 10*3/uL (ref 140–400)
RBC: 4.57 10*6/uL (ref 3.80–5.10)
RDW: 13.8 % (ref 11.0–15.0)
WBC: 6.7 10*3/uL (ref 3.8–10.8)

## 2024-02-25 LAB — COMPLETE METABOLIC PANEL WITHOUT GFR
AG Ratio: 1.5 (calc) (ref 1.0–2.5)
ALT: 7 U/L (ref 6–29)
AST: 10 U/L (ref 10–30)
Albumin: 4.5 g/dL (ref 3.6–5.1)
Alkaline phosphatase (APISO): 65 U/L (ref 31–125)
BUN/Creatinine Ratio: 9 (calc) (ref 6–22)
BUN: 6 mg/dL — ABNORMAL LOW (ref 7–25)
CO2: 27 mmol/L (ref 20–32)
Calcium: 9.5 mg/dL (ref 8.6–10.2)
Chloride: 101 mmol/L (ref 98–110)
Creat: 0.67 mg/dL (ref 0.50–0.96)
Globulin: 3 g/dL (ref 1.9–3.7)
Glucose, Bld: 85 mg/dL (ref 65–99)
Potassium: 4.3 mmol/L (ref 3.5–5.3)
Sodium: 136 mmol/L (ref 135–146)
Total Bilirubin: 0.6 mg/dL (ref 0.2–1.2)
Total Protein: 7.5 g/dL (ref 6.1–8.1)

## 2024-02-25 LAB — VITAMIN D 25 HYDROXY (VIT D DEFICIENCY, FRACTURES): Vit D, 25-Hydroxy: 8 ng/mL — ABNORMAL LOW (ref 30–100)

## 2024-02-25 LAB — THYROID PANEL WITH TSH
Free Thyroxine Index: 2.1 (ref 1.4–3.8)
T3 Uptake: 30 % (ref 22–35)
T4, Total: 6.9 ug/dL (ref 5.1–11.9)
TSH: 1.28 m[IU]/L

## 2024-02-25 LAB — LIPID PANEL
Cholesterol: 225 mg/dL — ABNORMAL HIGH (ref <200)
HDL: 46 mg/dL — ABNORMAL LOW (ref >=50)
LDL Cholesterol (Calc): 157 mg/dL — ABNORMAL HIGH
Non-HDL Cholesterol (Calc): 179 mg/dL — ABNORMAL HIGH (ref <130)
Total CHOL/HDL Ratio: 4.9 (calc) (ref <5.0)
Triglycerides: 103 mg/dL (ref <150)

## 2024-02-25 LAB — HEMOGLOBIN A1C W/OUT EAG: Hgb A1c MFr Bld: 6.1 % — ABNORMAL HIGH (ref <5.7)

## 2024-03-06 ENCOUNTER — Encounter: Payer: Self-pay | Admitting: Obstetrics and Gynecology

## 2024-03-06 NOTE — Telephone Encounter (Signed)
 Routing to Dr. Andrena Ke to review and advise on 02/24/24 labs.

## 2024-03-07 ENCOUNTER — Encounter: Payer: Self-pay | Admitting: Obstetrics and Gynecology

## 2024-03-07 DIAGNOSIS — E559 Vitamin D deficiency, unspecified: Secondary | ICD-10-CM | POA: Insufficient documentation

## 2024-03-07 MED ORDER — CHOLECALCIFEROL 625 MCG (25000 UT) PO CAPS: 1.0000 | ORAL_CAPSULE | ORAL | 1 refills | Status: AC

## 2024-03-07 MED ORDER — VITAMIN D 50 MCG (2000 UT) PO TABS: 2000.0000 [IU] | ORAL_TABLET | Freq: Every day | ORAL | 3 refills | Status: AC

## 2024-03-07 NOTE — Addendum Note (Signed)
 Addended by: Cleone Dad V on: 03/07/2024 05:11 PM   Modules accepted: Orders
